# Patient Record
Sex: Male | Born: 1995 | Race: Black or African American | Hispanic: No | Marital: Single | Smoking: Never smoker
Health system: Southern US, Community
[De-identification: ages and names within clinical notes are randomized; demographics above are authoritative.]

---

## 2014-06-05 ENCOUNTER — Emergency Department (HOSPITAL_COMMUNITY): Payer: Self-pay

## 2014-06-05 ENCOUNTER — Emergency Department (HOSPITAL_COMMUNITY)
Admission: EM | Admit: 2014-06-05 | Discharge: 2014-06-06 | Disposition: A | Discharge status: Home / Self Care | Payer: Self-pay | Attending: Emergency Medicine | Admitting: Emergency Medicine

## 2014-06-05 ENCOUNTER — Encounter (HOSPITAL_COMMUNITY): Payer: Self-pay | Admitting: Emergency Medicine

## 2014-06-05 DIAGNOSIS — Z79899 Other long term (current) drug therapy: Secondary | ICD-10-CM | POA: Insufficient documentation

## 2014-06-05 DIAGNOSIS — R05 Cough: Secondary | ICD-10-CM

## 2014-06-05 DIAGNOSIS — J029 Acute pharyngitis, unspecified: Secondary | ICD-10-CM | POA: Insufficient documentation

## 2014-06-05 DIAGNOSIS — R059 Cough, unspecified: Secondary | ICD-10-CM

## 2014-06-05 MED ORDER — IBUPROFEN 400 MG PO TABS
400.0000 mg | ORAL_TABLET | Freq: Once | ORAL | Status: AC
  Administered 2014-06-05: 400 mg via ORAL
  Filled 2014-06-05: qty 1

## 2014-06-05 MED ORDER — ACETAMINOPHEN 325 MG PO TABS
650.0000 mg | ORAL_TABLET | Freq: Once | ORAL | Status: AC
  Administered 2014-06-05: 650 mg via ORAL
  Filled 2014-06-05: qty 2

## 2014-06-05 NOTE — ED Notes (Signed)
Pt. reports sore throat with productive cough , chills , nasal congestion and SOB onset 3 days ago , denies fever , airway intact/ respirations unlabored .

## 2014-06-06 LAB — RAPID STREP SCREEN (MED CTR MEBANE ONLY): Streptococcus, Group A Screen (Direct): NEGATIVE

## 2014-06-06 LAB — CULTURE, GROUP A STREP

## 2014-06-06 MED ORDER — ALBUTEROL SULFATE HFA 108 (90 BASE) MCG/ACT IN AERS: 2.0000 | INHALATION_SPRAY | RESPIRATORY_TRACT | Status: AC | PRN

## 2014-06-06 MED ORDER — AMOXICILLIN 500 MG PO CAPS: 500.0000 mg | ORAL_CAPSULE | Freq: Two times a day (BID) | ORAL | Status: AC

## 2014-06-06 NOTE — Discharge Instructions (Signed)
Return to the emergency room with worsening of symptoms, new symptoms or with symptoms that are concerning, especially fevers, stiff neck, worsening headache, nausea/vomiting, visual changes or slurred speech, chest pain, shortness of breath, cough with thick colored mucous or blood. Drink plenty of fluids with electrolytes especially Gatorade. OTC cold medications such as mucinex, nyquil, dayquil are recommended. Chloraseptic for sore throat. Please take all of your antibiotics until finished!   You may develop abdominal discomfort or diarrhea from the antibiotic.  You may help offset this with probiotics which you can buy or get in yogurt. Do not eat  or take the probiotics until 2 hours after your antibiotic.

## 2014-06-06 NOTE — ED Notes (Signed)
Declined W/C at D/C and was escorted to lobby by RN. 

## 2014-06-06 NOTE — ED Notes (Signed)
ED PA at BS 

## 2014-06-06 NOTE — ED Provider Notes (Signed)
Medical screening examination/treatment/procedure(s) were performed by non-physician practitioner and as supervising physician I was immediately available for consultation/collaboration.   EKG Interpretation None       Arby BarretteMarcy Shandrea Lusk, MD 06/06/14 2350

## 2014-06-06 NOTE — ED Provider Notes (Signed)
CSN: [id number]     Arrival date & time 06/05/14  2303 History   First MD Initiated Contact with Patient 06/05/14 2352     Chief Complaint  Patient presents with  . Sore Throat  . Cough     (Consider location/radiation/quality/duration/timing/severity/associated sxs/prior Treatment) HPI Kevin Morton is a 18 y.o. male presenting with sore throat, fever, chills, productive cough with clear thin mucous, nasal congestion and SOB after coughing fits for past three days. Patient has not taken anything for it. Patient is not a smoker and had history of asthma but no exacerbations in 7-8 years. Patient denies sick contacts.    History reviewed. No pertinent past medical history. History reviewed. No pertinent past surgical history. No family history on file. History  Substance Use Topics  . Smoking status: Never Smoker   . Smokeless tobacco: Not on file  . Alcohol Use: Yes    Review of Systems  Constitutional: Positive for fever and chills.  HENT: Positive for congestion and rhinorrhea.   Respiratory: Positive for cough and shortness of breath.   Cardiovascular: Negative for chest pain.  Gastrointestinal: Negative for nausea, vomiting and diarrhea.  Skin: Negative for rash.  Neurological: Negative for weakness and headaches.      Allergies  Review of patient's allergies indicates no known allergies.  Home Medications   Prior to Admission medications   Medication Sig Start Date End Date Taking? Authorizing Provider  albuterol (PROVENTIL HFA;VENTOLIN HFA) 108 (90 BASE) MCG/ACT inhaler Inhale 2 puffs into the lungs every 4 (four) hours as needed for wheezing or shortness of breath. 06/06/14   Louann SjogrenVictoria L Rosselyn Martha, PA-C  amoxicillin (AMOXIL) 500 MG capsule Take 1 capsule (500 mg total) by mouth 2 (two) times daily. 06/06/14   Benetta SparVictoria L Sadie Hazelett, PA-C   BP 138/79  Pulse 107  Temp(Src) 100.8 F (38.2 C)  Resp 18  Wt 228 lb 1 oz (103.448 kg)  SpO2 100% Physical Exam  Nursing note  and vitals reviewed. Constitutional: He appears well-developed and well-nourished. No distress.  HENT:  Head: Normocephalic and atraumatic.  Nose: Right sinus exhibits no maxillary sinus tenderness and no frontal sinus tenderness. Left sinus exhibits no maxillary sinus tenderness and no frontal sinus tenderness.  Mouth/Throat: Mucous membranes are normal. No trismus in the jaw. No uvula swelling. Oropharyngeal exudate, posterior oropharyngeal edema and posterior oropharyngeal erythema present.  No uvula deviation  Eyes: Conjunctivae and EOM are normal. Right eye exhibits no discharge. Left eye exhibits no discharge.  Neck: Normal range of motion. Neck supple.  Cardiovascular: Normal rate, regular rhythm and normal heart sounds.   Pulmonary/Chest: Effort normal and breath sounds normal. No respiratory distress. He has no wheezes. He has no rales.  Abdominal: Soft. Bowel sounds are normal. He exhibits no distension. There is no tenderness.  Lymphadenopathy:    He has cervical adenopathy.  Neurological: He is alert.  Skin: Skin is warm and dry. He is not diaphoretic.    ED Course  Procedures (including critical care time) Labs Review Labs Reviewed  RAPID STREP SCREEN  CULTURE, GROUP A STREP    Imaging Review Dg Chest 2 View  06/05/2014   CLINICAL DATA:  Cough, mid chest pain for 3 days.  No fever.  EXAM: CHEST  2 VIEW  COMPARISON:  None.  FINDINGS: Cardiomediastinal silhouette is unremarkable. The lungs are clear without pleural effusions or focal consolidations. Trachea projects midline and there is no pneumothorax. Soft tissue planes and included osseous structures are non-suspicious.  IMPRESSION:  No active cardiopulmonary disease.  Normal chest.   Electronically Signed   By: Awilda Metroourtnay  Bloomer   On: 06/05/2014 23:35     EKG Interpretation None      MDM   Final diagnoses:  Pharyngitis   Pt febrile with tonsillar exudate, cervical lymphadenopathy, & dysphagia; patient with  negative rapid strep but I suspect strep due to presentation and PE. Pt does not appear dehydrated, discussed importance of water rehydration. Presentation non concerning for PTA or infxn spread to soft tissue. No trismus or uvula deviation. Specific return precautions discussed. Pt able to drink water in ED without difficulty with intact air way. Recommended PCP follow up.   Discussed return precautions with patient. Discussed all results and patient verbalizes understanding and agrees with plan.      Louann SjogrenVictoria L Lateesha Bezold, PA-C 06/06/14 [phone]

## 2015-08-31 IMAGING — CR DG CHEST 2V
2 series · 2 of 2 positions shown · non-contrast
Comparison: None.

CLINICAL DATA: Cough, mid chest pain for 3 days.  No fever.

EXAM:
CHEST  2 VIEW

[w chest pa]
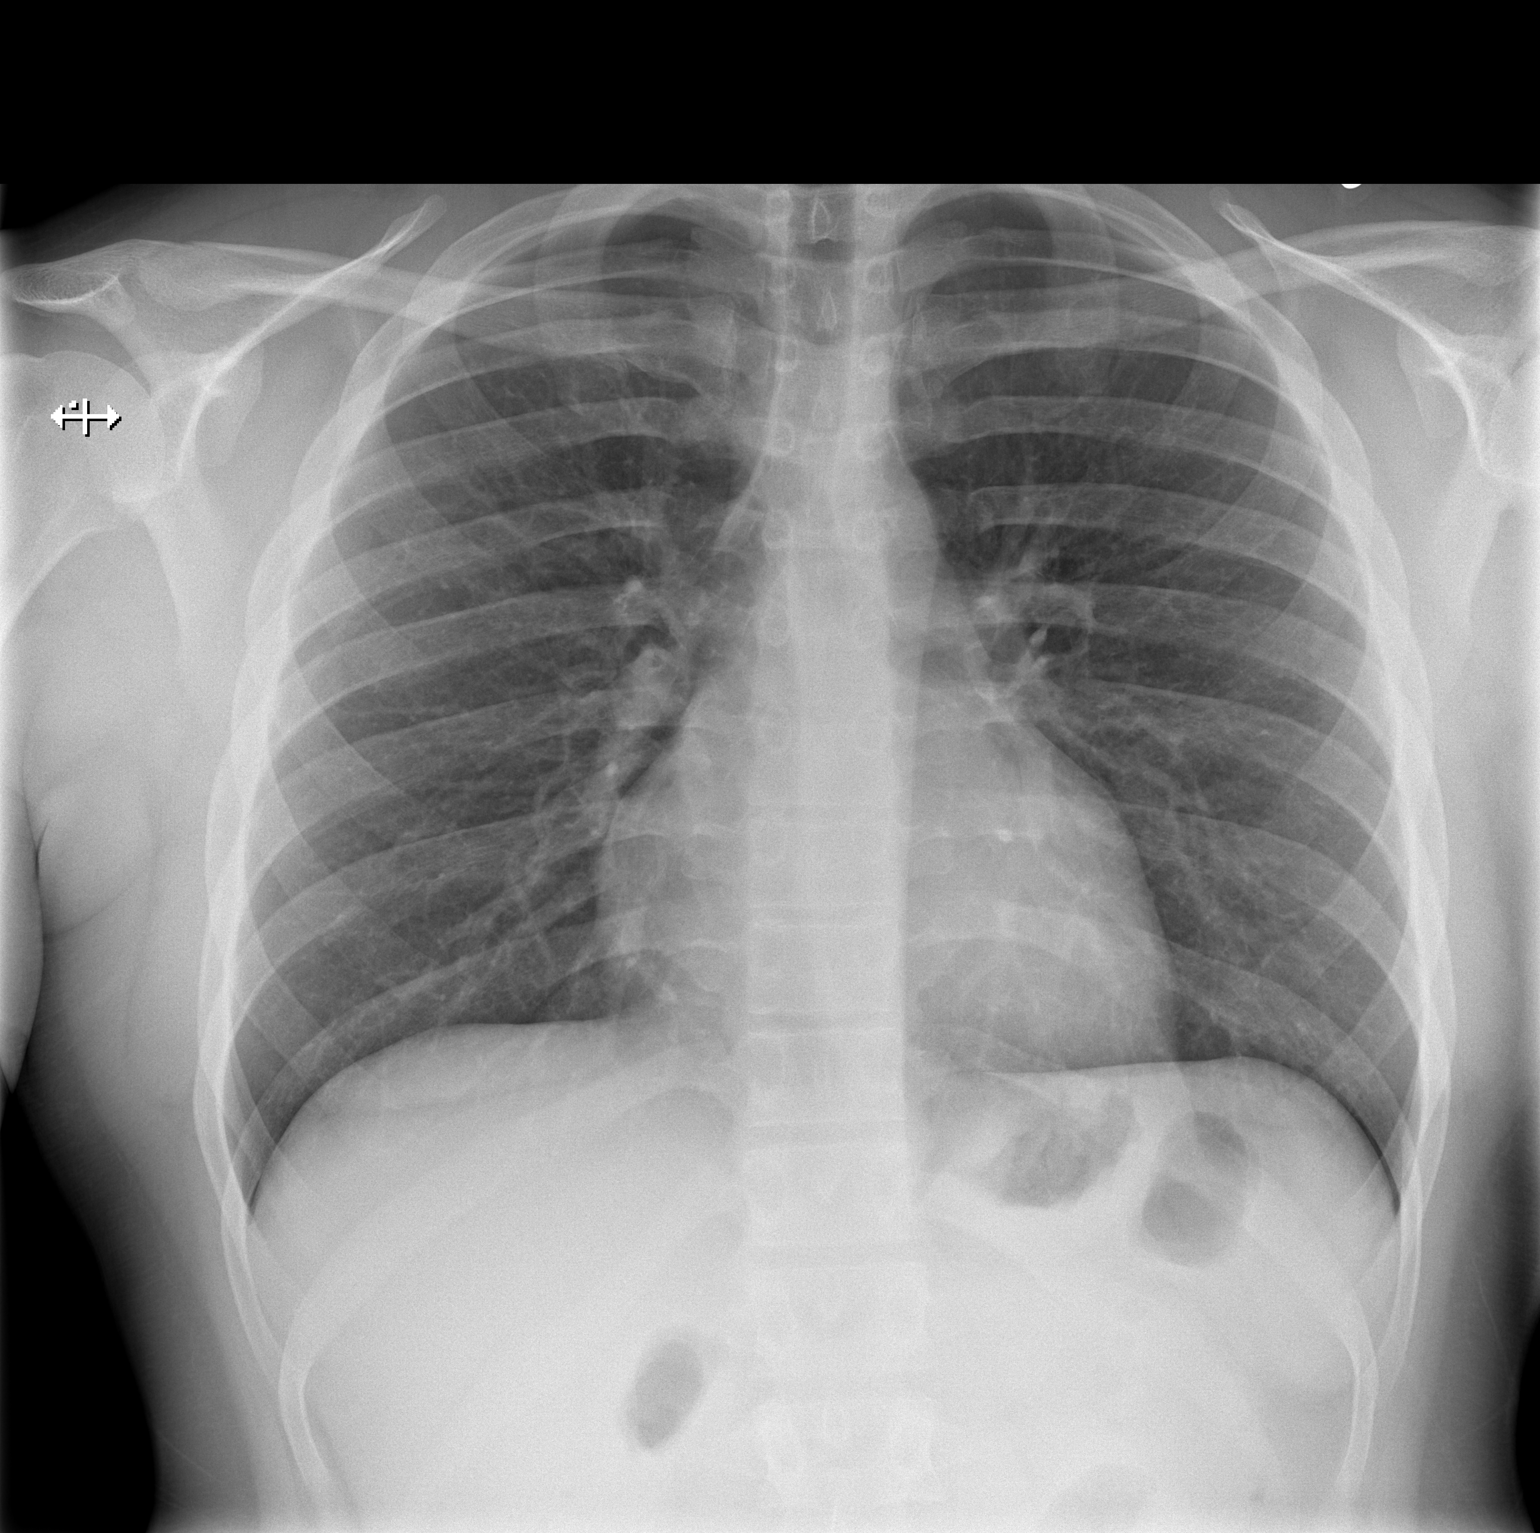

[w chest lat]
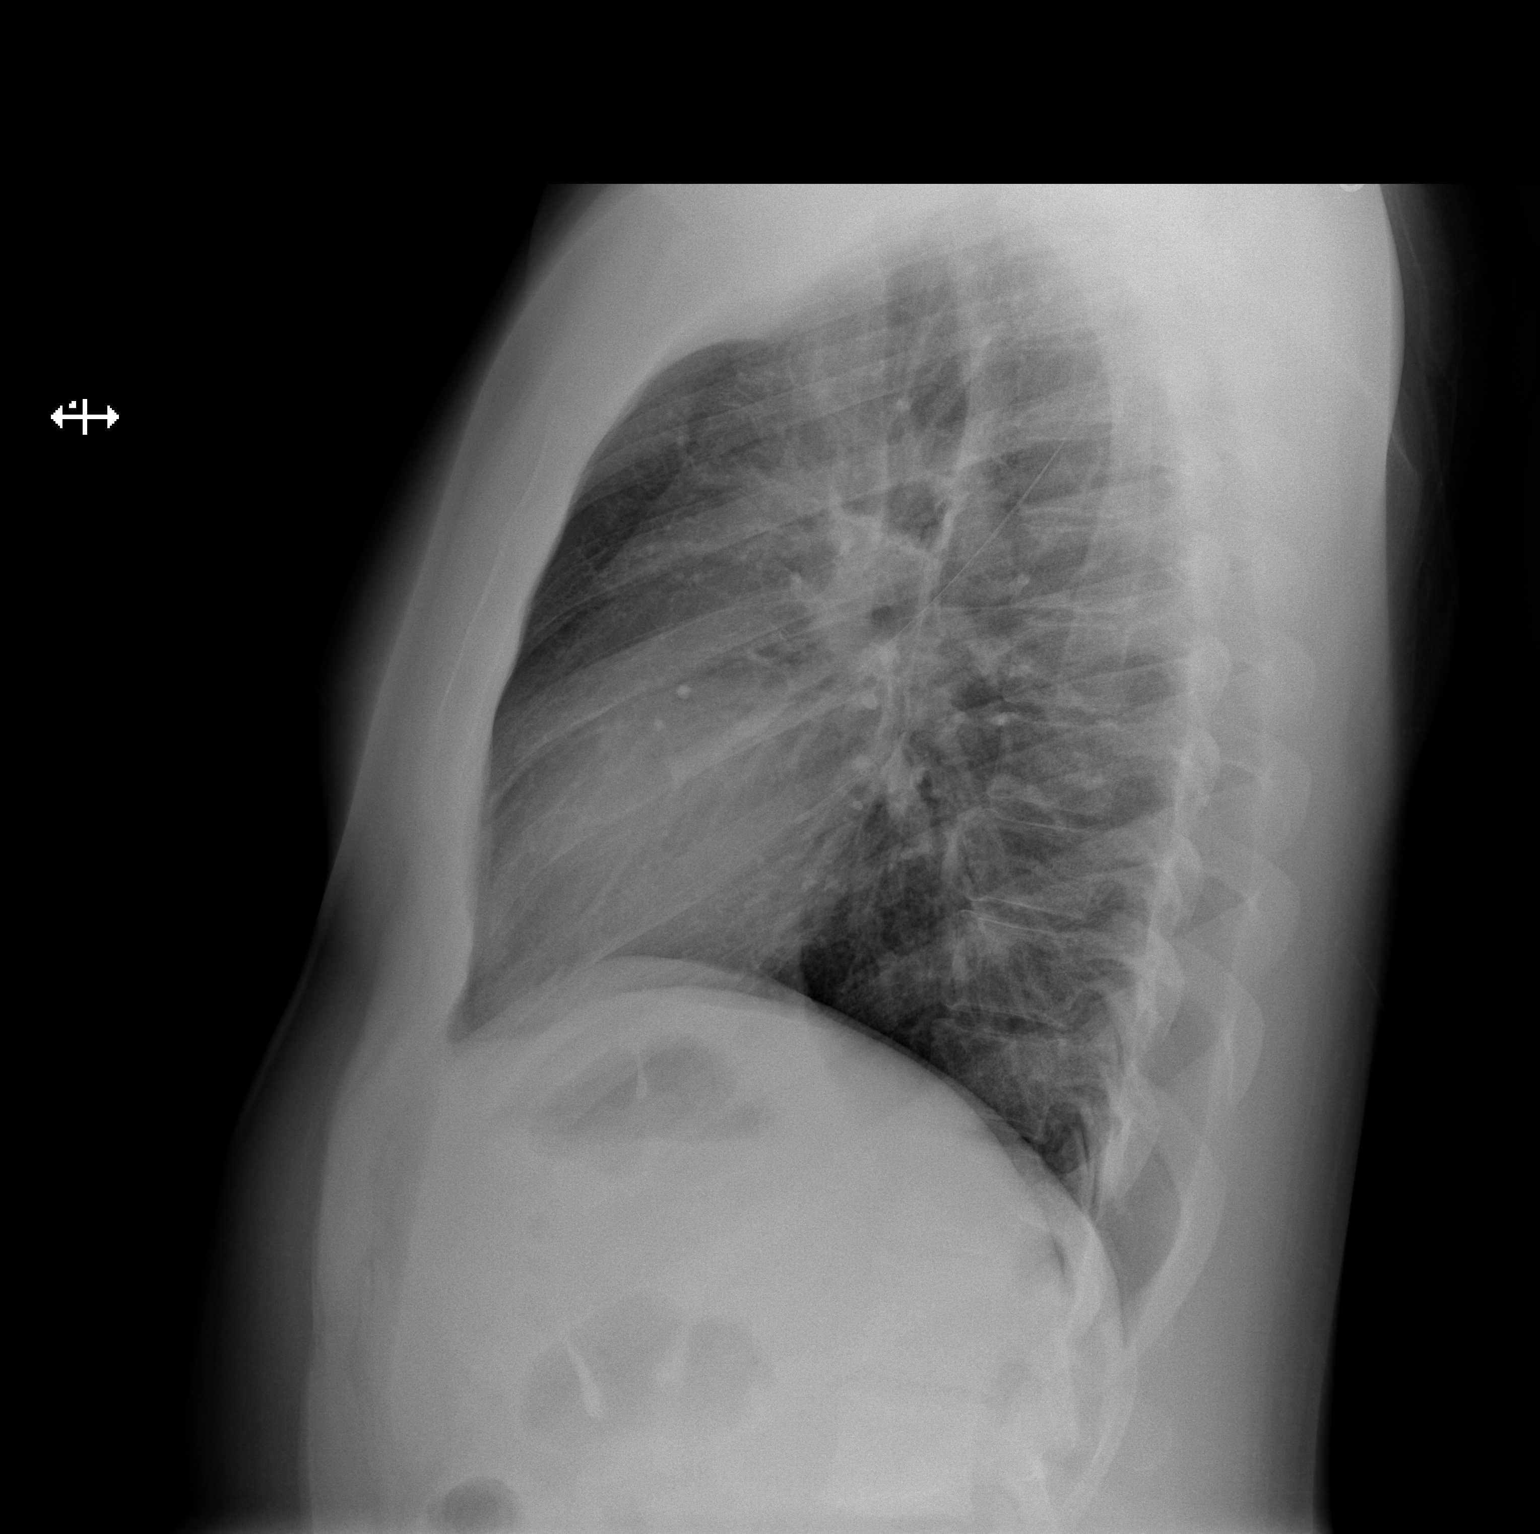

[2 of 2 positions shown; findings below may reference images not displayed]

FINDINGS: Cardiomediastinal silhouette is unremarkable. The lungs are clear
without pleural effusions or focal consolidations. Trachea projects
midline and there is no pneumothorax. Soft tissue planes and
included osseous structures are non-suspicious.
IMPRESSION: No active cardiopulmonary disease.  Normal chest.

  By: Liulichka Poddubniy
